# Patient Record
Sex: Female | Born: 1965 | Race: Black or African American | Hispanic: No | Smoking: Never smoker
Health system: Southern US, Community
[De-identification: ages and names within clinical notes are randomized; demographics above are authoritative.]

## PROBLEM LIST (undated history)

## (undated) DIAGNOSIS — M459 Ankylosing spondylitis of unspecified sites in spine: Secondary | ICD-10-CM

## (undated) DIAGNOSIS — E282 Polycystic ovarian syndrome: Secondary | ICD-10-CM

## (undated) DIAGNOSIS — H209 Unspecified iridocyclitis: Secondary | ICD-10-CM

## (undated) DIAGNOSIS — J45909 Unspecified asthma, uncomplicated: Secondary | ICD-10-CM

## (undated) DIAGNOSIS — IMO0002 Reserved for concepts with insufficient information to code with codable children: Secondary | ICD-10-CM

## (undated) DIAGNOSIS — G4730 Sleep apnea, unspecified: Secondary | ICD-10-CM

## (undated) HISTORY — DX: Sleep apnea, unspecified: G47.30

## (undated) HISTORY — DX: Unspecified asthma, uncomplicated: J45.909

## (undated) HISTORY — DX: Reserved for concepts with insufficient information to code with codable children: IMO0002

## (undated) HISTORY — PX: OTHER SURGICAL HISTORY: SHX169

## (undated) HISTORY — DX: Ankylosing spondylitis of unspecified sites in spine: M45.9

## (undated) HISTORY — PX: LIPOSUCTION: SHX10

## (undated) HISTORY — DX: Unspecified iridocyclitis: H20.9

## (undated) HISTORY — DX: Polycystic ovarian syndrome: E28.2

---

## 2000-09-05 HISTORY — PX: LEEP: SHX91

## 2005-11-27 ENCOUNTER — Emergency Department (HOSPITAL_COMMUNITY): Admission: EM | Admit: 2005-11-27 | Discharge: 2005-11-27 | Payer: Self-pay | Admitting: Emergency Medicine

## 2006-02-27 ENCOUNTER — Ambulatory Visit (HOSPITAL_COMMUNITY): Admission: RE | Admit: 2006-02-27 | Discharge: 2006-02-27 | Payer: Self-pay | Admitting: Ophthalmology

## 2007-06-01 ENCOUNTER — Encounter: Admission: RE | Admit: 2007-06-01 | Discharge: 2007-06-01 | Payer: Self-pay | Admitting: Obstetrics & Gynecology

## 2007-08-09 ENCOUNTER — Other Ambulatory Visit: Payer: Self-pay | Admitting: Family Medicine

## 2007-08-09 ENCOUNTER — Inpatient Hospital Stay (HOSPITAL_COMMUNITY): Admission: AD | Admit: 2007-08-09 | Discharge: 2007-08-10 | Payer: Self-pay | Admitting: Obstetrics

## 2007-08-09 ENCOUNTER — Other Ambulatory Visit: Payer: Self-pay | Admitting: Emergency Medicine

## 2007-08-30 ENCOUNTER — Emergency Department (HOSPITAL_COMMUNITY): Admission: EM | Admit: 2007-08-30 | Discharge: 2007-08-30 | Payer: Self-pay | Admitting: Emergency Medicine

## 2007-09-01 ENCOUNTER — Emergency Department (HOSPITAL_COMMUNITY): Admission: EM | Admit: 2007-09-01 | Discharge: 2007-09-01 | Payer: Self-pay | Admitting: Family Medicine

## 2007-09-04 ENCOUNTER — Emergency Department (HOSPITAL_COMMUNITY): Admission: EM | Admit: 2007-09-04 | Discharge: 2007-09-04 | Payer: Self-pay | Admitting: Emergency Medicine

## 2007-09-09 ENCOUNTER — Emergency Department (HOSPITAL_COMMUNITY): Admission: EM | Admit: 2007-09-09 | Discharge: 2007-09-09 | Payer: Self-pay | Admitting: Emergency Medicine

## 2008-06-02 ENCOUNTER — Encounter: Admission: RE | Admit: 2008-06-02 | Discharge: 2008-06-02 | Payer: Self-pay | Admitting: Obstetrics

## 2008-06-17 ENCOUNTER — Other Ambulatory Visit: Admission: RE | Admit: 2008-06-17 | Discharge: 2008-06-17 | Payer: Self-pay | Admitting: Obstetrics and Gynecology

## 2008-06-20 ENCOUNTER — Ambulatory Visit (HOSPITAL_COMMUNITY): Admission: RE | Admit: 2008-06-20 | Discharge: 2008-06-20 | Payer: Self-pay | Admitting: Obstetrics and Gynecology

## 2009-02-19 ENCOUNTER — Emergency Department (HOSPITAL_COMMUNITY): Admission: EM | Admit: 2009-02-19 | Discharge: 2009-02-19 | Payer: Self-pay | Admitting: Family Medicine

## 2009-04-14 ENCOUNTER — Emergency Department (HOSPITAL_COMMUNITY): Admission: EM | Admit: 2009-04-14 | Discharge: 2009-04-14 | Payer: Self-pay | Admitting: Emergency Medicine

## 2009-05-18 ENCOUNTER — Emergency Department (HOSPITAL_COMMUNITY): Admission: EM | Admit: 2009-05-18 | Discharge: 2009-05-18 | Payer: Self-pay | Admitting: Emergency Medicine

## 2009-08-21 ENCOUNTER — Ambulatory Visit (HOSPITAL_COMMUNITY): Admission: RE | Admit: 2009-08-21 | Discharge: 2009-08-21 | Payer: Self-pay | Admitting: Obstetrics and Gynecology

## 2009-10-09 ENCOUNTER — Encounter: Admission: RE | Admit: 2009-10-09 | Discharge: 2009-10-09 | Payer: Self-pay | Admitting: Obstetrics and Gynecology

## 2009-11-03 ENCOUNTER — Encounter (INDEPENDENT_AMBULATORY_CARE_PROVIDER_SITE_OTHER): Payer: Self-pay | Admitting: Obstetrics and Gynecology

## 2009-11-03 ENCOUNTER — Ambulatory Visit (HOSPITAL_COMMUNITY): Admission: RE | Admit: 2009-11-03 | Discharge: 2009-11-04 | Payer: Self-pay | Admitting: Obstetrics and Gynecology

## 2009-11-03 HISTORY — PX: OTHER SURGICAL HISTORY: SHX169

## 2009-11-06 ENCOUNTER — Inpatient Hospital Stay (HOSPITAL_COMMUNITY): Admission: AD | Admit: 2009-11-06 | Discharge: 2009-11-06 | Payer: Self-pay | Admitting: Obstetrics and Gynecology

## 2009-12-09 ENCOUNTER — Ambulatory Visit: Admission: RE | Admit: 2009-12-09 | Discharge: 2009-12-09 | Payer: Self-pay | Admitting: Gynecology

## 2009-12-10 ENCOUNTER — Ambulatory Visit (HOSPITAL_COMMUNITY): Admission: RE | Admit: 2009-12-10 | Discharge: 2009-12-10 | Payer: Self-pay | Admitting: Gynecology

## 2009-12-25 ENCOUNTER — Ambulatory Visit (HOSPITAL_COMMUNITY): Admission: RE | Admit: 2009-12-25 | Discharge: 2009-12-25 | Payer: Self-pay | Admitting: Gynecology

## 2010-06-11 ENCOUNTER — Ambulatory Visit: Admission: RE | Admit: 2010-06-11 | Discharge: 2010-06-11 | Payer: Self-pay | Admitting: Gynecology

## 2010-11-24 LAB — MISCELLANEOUS TEST

## 2010-11-25 LAB — BASIC METABOLIC PANEL
CO2: 27 mEq/L (ref 19–32)
Calcium: 8.8 mg/dL (ref 8.4–10.5)
Chloride: 109 mEq/L (ref 96–112)
GFR calc Af Amer: >60 mL/min (ref >60)
Potassium: 3.1 mEq/L — ABNORMAL LOW (ref 3.5–5.1)

## 2010-11-25 LAB — CBC
Hemoglobin: 13.7 g/dL (ref 12.0–15.0)
RBC: 5.02 MIL/uL (ref 3.87–5.11)
WBC: 4.1 10*3/uL (ref 4.0–10.5)

## 2010-11-25 LAB — PREGNANCY, URINE: Preg Test, Ur: NEGATIVE

## 2010-11-25 LAB — TYPE AND SCREEN

## 2010-12-06 LAB — CBC
MCHC: 33.1 g/dL (ref 30.0–36.0)
MCV: 83.9 fL (ref 78.0–100.0)
RDW: 14.9 % (ref 11.5–15.5)
WBC: 5.4 10*3/uL (ref 4.0–10.5)

## 2010-12-06 LAB — BASIC METABOLIC PANEL
BUN: 9 mg/dL (ref 6–23)
CO2: 25 mEq/L (ref 19–32)
Chloride: 105 mEq/L (ref 96–112)
Creatinine, Ser: 0.67 mg/dL (ref 0.4–1.2)
GFR calc non Af Amer: >60 mL/min (ref >60)
Sodium: 137 mEq/L (ref 135–145)

## 2010-12-10 LAB — DIFFERENTIAL
Basophils Absolute: 0 10*3/uL (ref 0.0–0.1)
Basophils Relative: 1 % (ref 0–1)
Eosinophils Relative: 1 % (ref 0–5)
Lymphocytes Relative: 29 % (ref 12–46)
Monocytes Absolute: 0.4 10*3/uL (ref 0.1–1.0)

## 2010-12-10 LAB — CBC
HCT: 43.4 % (ref 36.0–46.0)
MCHC: 32.7 g/dL (ref 30.0–36.0)
Platelets: 177 10*3/uL (ref 150–400)
RBC: 5.14 MIL/uL — ABNORMAL HIGH (ref 3.87–5.11)

## 2010-12-10 LAB — URINALYSIS, ROUTINE W REFLEX MICROSCOPIC
Bilirubin Urine: NEGATIVE
Hgb urine dipstick: NEGATIVE
Nitrite: NEGATIVE
Protein, ur: NEGATIVE mg/dL
Urobilinogen, UA: 0.2 mg/dL (ref 0.0–1.0)

## 2010-12-10 LAB — COMPREHENSIVE METABOLIC PANEL
AST: 21 U/L (ref 0–37)
Albumin: 4 g/dL (ref 3.5–5.2)
Alkaline Phosphatase: 79 U/L (ref 39–117)
Chloride: 104 mEq/L (ref 96–112)
GFR calc Af Amer: >60 mL/min (ref >60)
Potassium: 3.7 mEq/L (ref 3.5–5.1)
Sodium: 140 mEq/L (ref 135–145)
Total Bilirubin: 0.8 mg/dL (ref 0.3–1.2)

## 2010-12-10 LAB — URINE MICROSCOPIC-ADD ON

## 2010-12-11 LAB — CBC
HCT: 45.3 % (ref 36.0–46.0)
Hemoglobin: 14.7 g/dL (ref 12.0–15.0)
MCHC: 32.5 g/dL (ref 30.0–36.0)
MCV: 81.4 fL (ref 78.0–100.0)
RDW: 16.3 % — ABNORMAL HIGH (ref 11.5–15.5)

## 2010-12-11 LAB — URINALYSIS, ROUTINE W REFLEX MICROSCOPIC
Bilirubin Urine: NEGATIVE
Ketones, ur: NEGATIVE mg/dL
Nitrite: NEGATIVE
pH: 5 (ref 5.0–8.0)

## 2010-12-11 LAB — WET PREP, GENITAL: WBC, Wet Prep HPF POC: NONE SEEN

## 2010-12-11 LAB — DIFFERENTIAL
Basophils Absolute: 0.1 10*3/uL (ref 0.0–0.1)
Basophils Relative: 1 % (ref 0–1)
Eosinophils Absolute: 0.1 10*3/uL (ref 0.0–0.7)
Eosinophils Relative: 2 % (ref 0–5)
Monocytes Absolute: 0.6 10*3/uL (ref 0.1–1.0)
Neutro Abs: 2.6 10*3/uL (ref 1.7–7.7)

## 2010-12-11 LAB — BASIC METABOLIC PANEL
CO2: 23 mEq/L (ref 19–32)
Calcium: 9 mg/dL (ref 8.4–10.5)
Chloride: 105 mEq/L (ref 96–112)
Glucose, Bld: 93 mg/dL (ref 70–99)
Sodium: 137 mEq/L (ref 135–145)

## 2010-12-13 LAB — POCT RAPID STREP A (OFFICE): Streptococcus, Group A Screen (Direct): NEGATIVE

## 2011-01-11 ENCOUNTER — Other Ambulatory Visit: Payer: Self-pay | Admitting: Obstetrics and Gynecology

## 2011-01-11 DIAGNOSIS — Z1231 Encounter for screening mammogram for malignant neoplasm of breast: Secondary | ICD-10-CM

## 2011-01-17 ENCOUNTER — Ambulatory Visit
Admission: RE | Admit: 2011-01-17 | Discharge: 2011-01-17 | Disposition: A | Discharge status: Home / Self Care | Payer: Federal, State, Local not specified - PPO | Source: Ambulatory Visit | Attending: Obstetrics and Gynecology | Admitting: Obstetrics and Gynecology

## 2011-01-17 DIAGNOSIS — Z1231 Encounter for screening mammogram for malignant neoplasm of breast: Secondary | ICD-10-CM

## 2011-01-18 NOTE — H&P (Signed)
Bridget Harrison, Bridget Harrison               ACCOUNT NO.:  [id number]   MEDICAL RECORD NO.:  [id number]          PATIENT TYPE:  MAT   LOCATION:  [facility]                          FACILITY:  [facility]   PHYSICIAN:  Lendon Colonel, MD   DATE OF BIRTH:  [birth year]   DATE OF ADMISSION:  08/09/2007  DATE OF DISCHARGE:  08/10/2007                              HISTORY & PHYSICAL   CHIEF COMPLAINT:  Vaginal bleeding, pelvic pain.   HISTORY OF PRESENT ILLNESS:  This is a 45 year old G0, non-pregnant  patient who presents with increasing vaginal bleeding and pelvic pain.  Of note, the patient was seen in the office today for similar  complaints.  Pelvic exam showed two scopettes of blood and a hemoglobin  of 15.5.  Her hemoglobin one month ago was 15.5.  The patient is on her  withdrawal week of her first pack of OCP's.  The patient was started on  OCP's approximately three weeks ago for history of PCOS and  oligomenorrhea and attempt for cycle control.  After leaving the  [facility] office earlier today the patient had worsening pelvic  pain and the pain was a 9 out of 10.  She presented to [facility].  There she was found to have moderate amount of vaginal bleeding  with overall normal exam. Her hemoglobin there was 17 and her  chemistries were within normal limits.  The patient was discharged with  instructions to followup here at [facility].  The patient notes her pain after  taking 800 mg of Motrin that she rates of 8 to 9 to a #1.  She still  notes bleeding, changing pads every one hour.  The patient denies  headache, dizziness, chest pain or shortness of breath.   PAST MEDICAL HISTORY:  Significant for oligomenorrhea and polycystic  ovarian syndrome.   PHYSICAL EXAMINATION:  VITAL SIGNS:  On admission she was afebrile.  She  was slightly hypertensive but was not tachycardic.  CARDIOVASCULAR:  Regular rate and rhythm.  PULMONARY:  Clear to auscultation bilaterally.  ABDOMEN:  Soft,  nontender, no rebound, no guarding.  GENITOURINARY:  Exam reveals moderate amount of bleeding from the  cervix.  Three scoppettes of blood and clot in the vagina.   ASSESSMENT/PLAN:  In summary, this is a 45 year old G0 with polycystic  ovarian syndrome, oligomenorrhea, and menorrhagia.  The patient was  reassured, given her normal hemoglobin in the office today and again at  [facility].  The patient was instructed to increase  hydration and was instructed to return for worsening bleeding, worsening  pelvic pain, shortness of breath, dizziness, light headedness or chest  pain.  The patient was instructed on Motrin 800 mg three times daily to  be taken until the bleeding ceases.  She was also instructed to start  high dose oral contraception pills.  The patient has been given a  prescription for Lo-Estrin earlier today and she was instructed to take  two pills p.o. q.12h. for 3 days and two pills p.o. daily for 3 days and  then 1 pill for the  remainder  of the pack.  She is instructed to skip her placebo week and begin a new  pack right away.  The patient will follow up in the office on August 20, 2007 for ultrasound and follow up of vaginal bleeding.  She was  instructed to call sooner with any problems.  The patient was also given  Percocet #10 to take q.4-6h. p.r.n. break-through pain.      Lendon Colonel, MD  Electronically Signed     KAF/MEDQ  D:  08/10/2007  T:  08/10/2007  Job:  [id number]

## 2011-04-21 IMAGING — US US PELVIS COMPLETE MODIFY
1 series · 13 of 25 positions shown · non-contrast
Comparison: None

CLINICAL DATA: Abdominal and pelvic pain.

TRANSABDOMINAL AND TRANSVAGINAL ULTRASOUND OF PELVIS
TECHNIQUE: Both transabdominal and transvaginal ultrasound
examinations of the pelvis were performed including evaluation of
the uterus, ovaries, adnexal regions, and pelvic cul-de-sac.

[Series 1: us pelvis complete modify · 0.28mm/px · 13 of 106 slices shown]
[im 1/106]
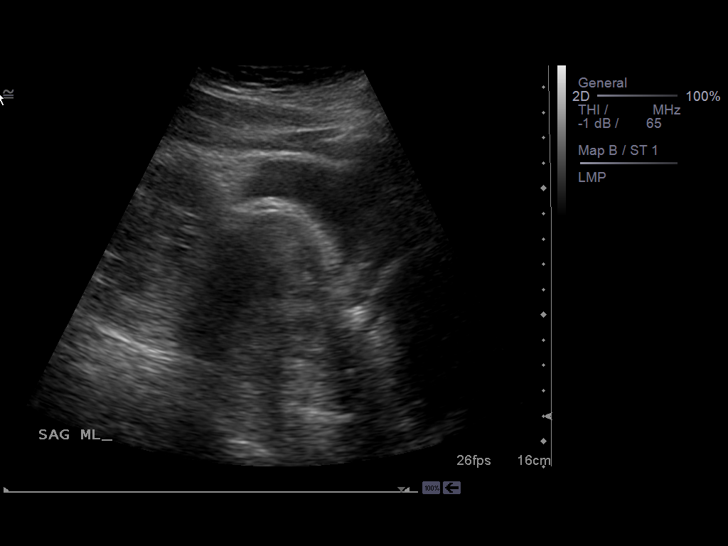
[im 9/106]
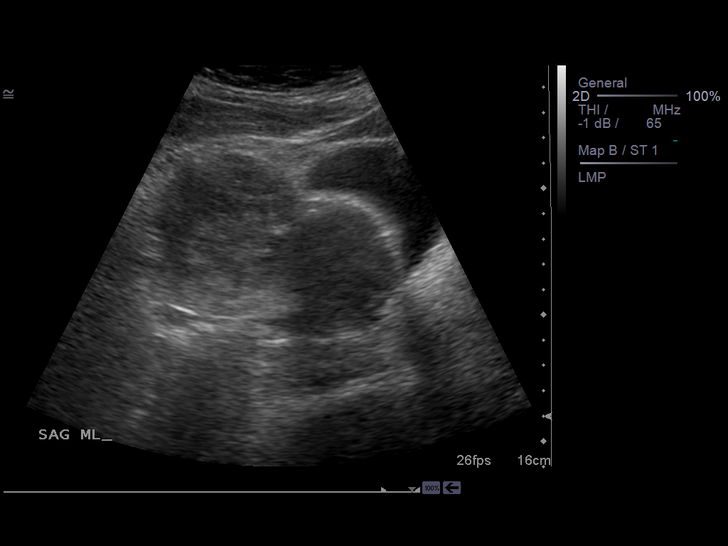
[im 18/106]
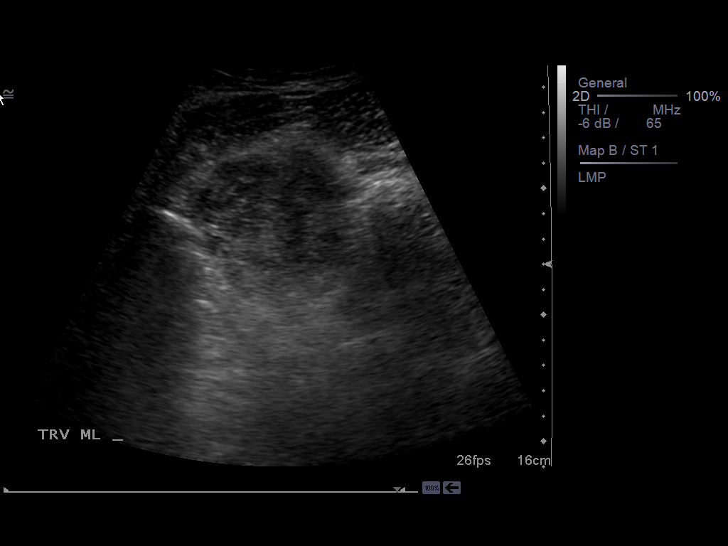
[im 27/106]
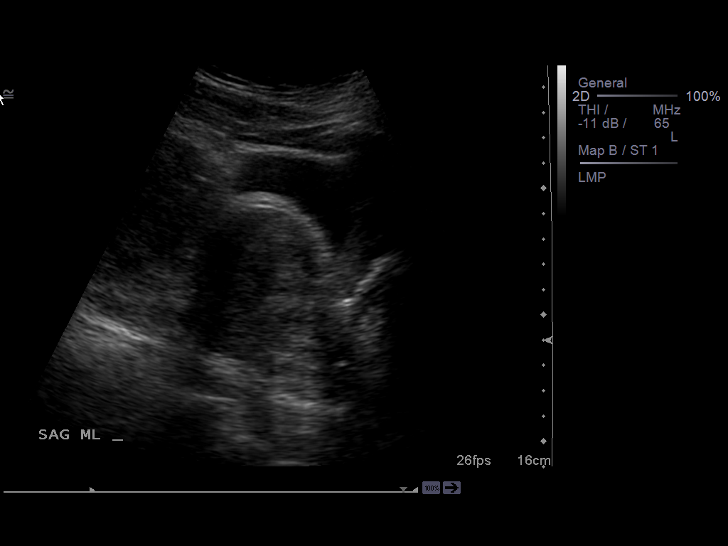
[im 36/106]
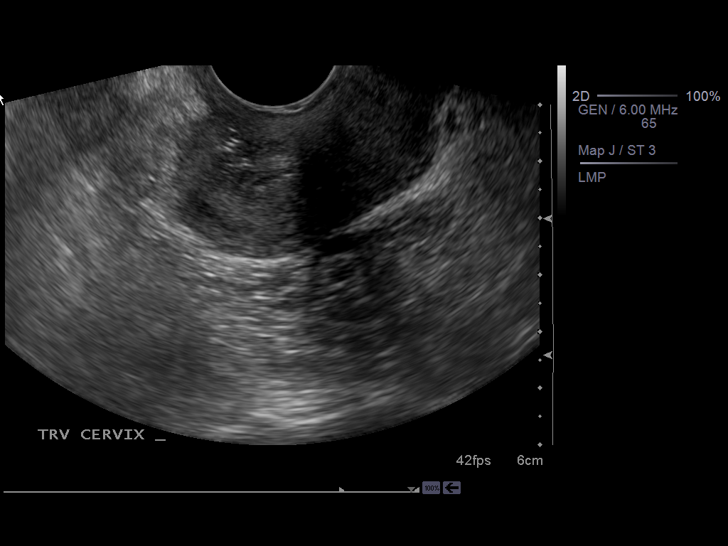
[im 44/106]
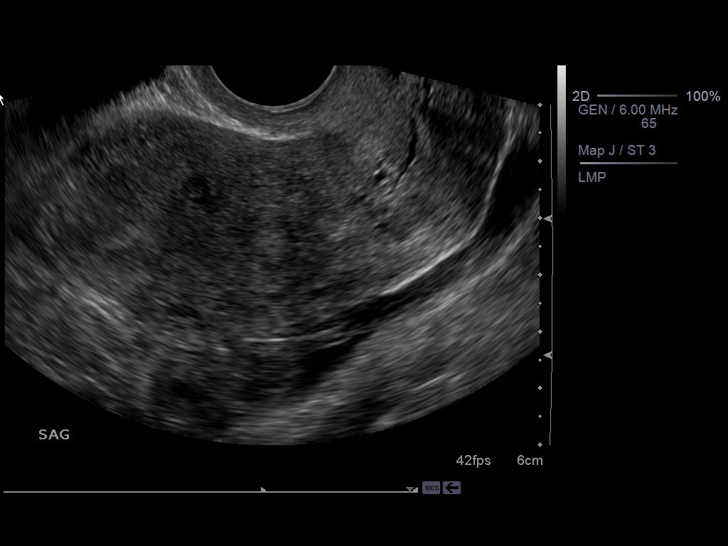
[im 53/106]
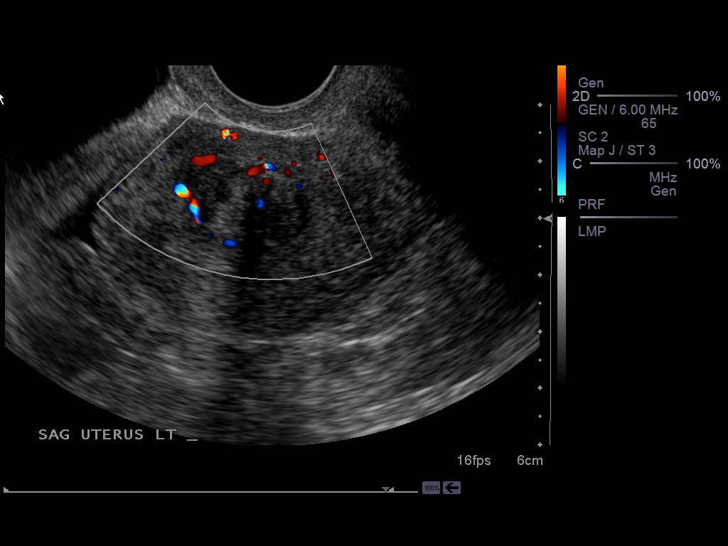
[im 62/106]
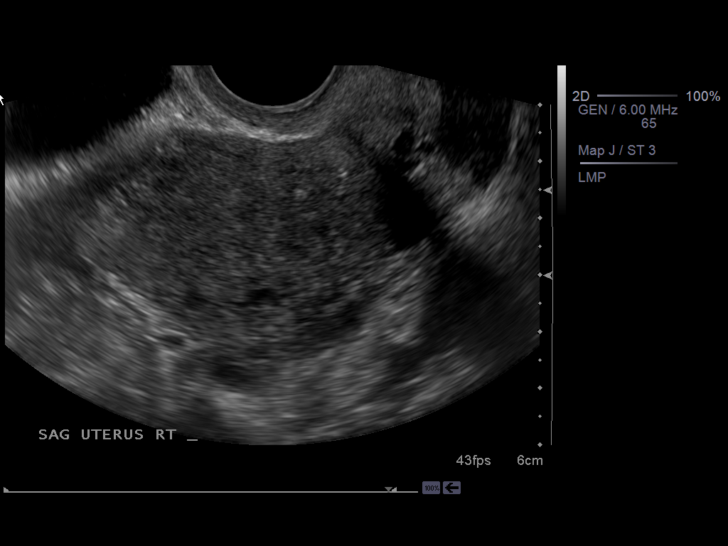
[im 71/106]
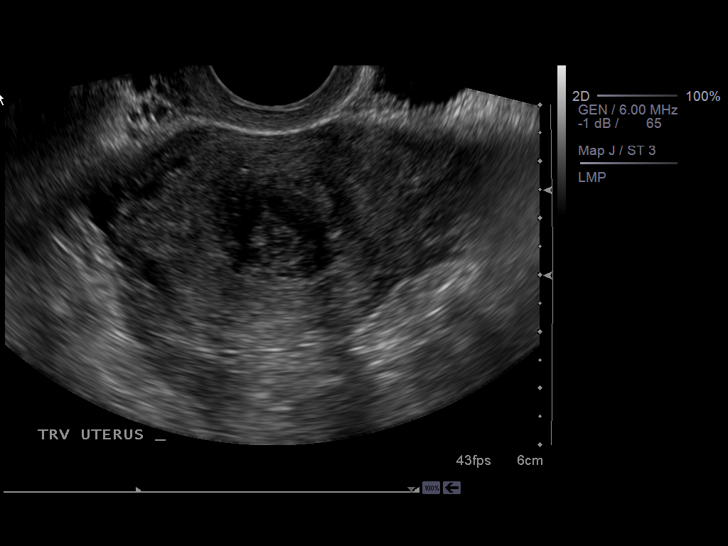
[im 79/106]
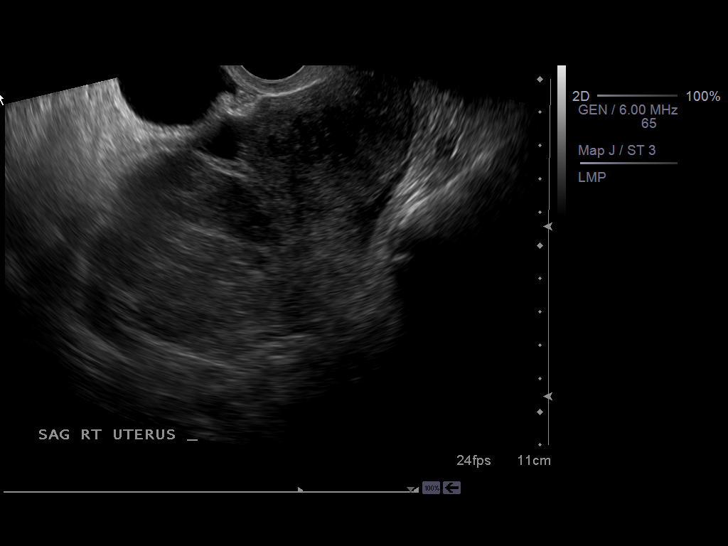
[im 88/106]
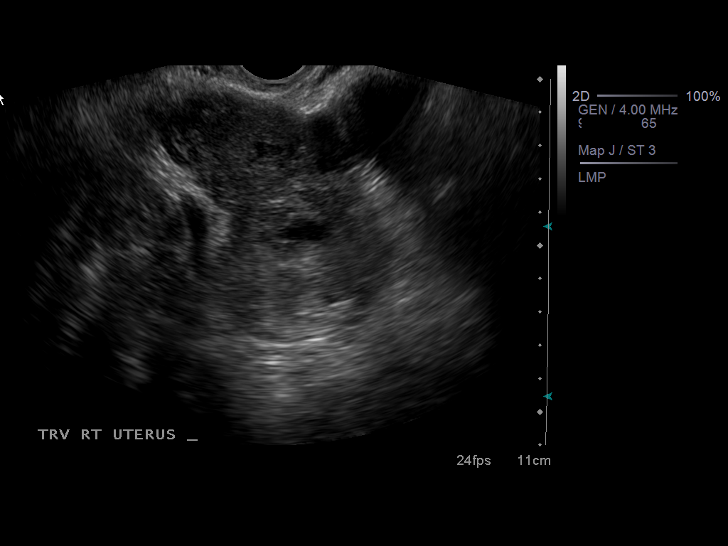
[im 97/106]
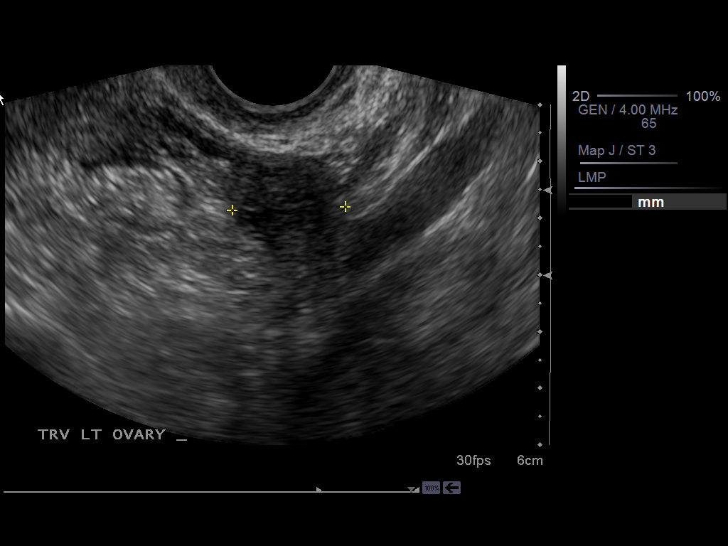
[im 106/106]
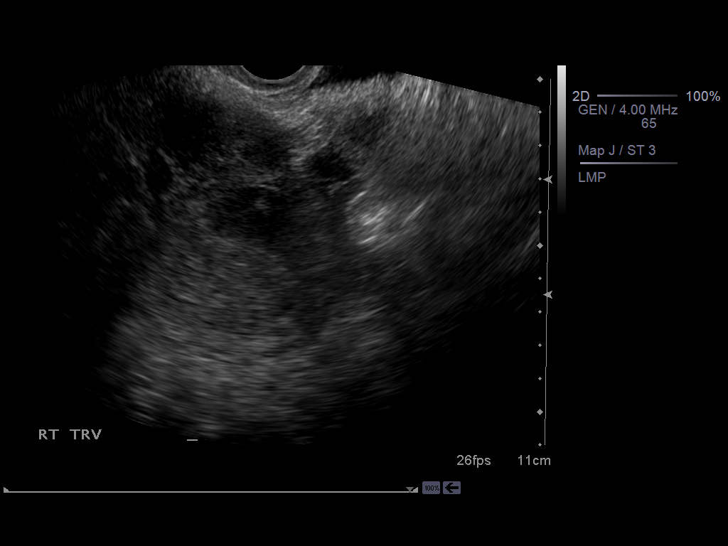

[13 of 25 positions shown; findings below may reference images not displayed]

FINDINGS: Uterus measures 8.6 x 4.3 x 6.1 cm.  Multiple fibroids are
identified.  The largest is pedunculated and in the posterior
fundal region, measuring 8.4 x 5.8 x 6.3 cm.  An anterior
pedunculated fibroid is 2.7 x 2.5 x 2.1 cm.  A central uterine
fibroid is 2.4 x 1.7 x 2.1 cm.  This latter fibroid displaces the
endometrium posteriorly.  A lower uterine segment central fibroid
is 1.4 x 1.3 x 1.5 cm. Small fundal fibroid is 1.0 x 0.8 x 1.0 cm.

Endometrium measures 6 mm in thickness.  The endometrium is
posteriorly displaced by central uterine fibroids.

Right Ovary is not visualized.

Left Ovary measures 2.1 x 1.7 x 2.0 cm.

Other Findings:  No free pelvic fluid is identified.
IMPRESSION: Multiple uterine fibroids.  The largest, pedunculated fibroid in
the posterior fundal region has increased in size compared to the
prior ultrasound exam on 06/20/2008.  On the prior study, this
fibroid measured 5.6 x 4.7 x 5.1 cm.

## 2011-06-10 LAB — RAPID STREP SCREEN (MED CTR MEBANE ONLY): Streptococcus, Group A Screen (Direct): NEGATIVE

## 2011-06-10 LAB — STREP A DNA PROBE

## 2011-06-13 LAB — POCT URINALYSIS DIP (DEVICE)
Ketones, ur: 15 — AB
Protein, ur: 100 — AB
Specific Gravity, Urine: >=1.03
Urobilinogen, UA: 1

## 2011-06-13 LAB — I-STAT 8, (EC8 V) (CONVERTED LAB)
Acid-Base Excess: 1
Chloride: 107
Glucose, Bld: 91
Hemoglobin: 17 — ABNORMAL HIGH
Potassium: 3.9
Sodium: 139
pH, Ven: 7.396 — ABNORMAL HIGH

## 2011-06-13 LAB — POCT PREGNANCY, URINE: Operator id: 239701

## 2012-01-17 ENCOUNTER — Telehealth: Payer: Self-pay | Admitting: Obstetrics and Gynecology

## 2012-01-17 NOTE — Telephone Encounter (Signed)
SR PT 

## 2012-01-17 NOTE — Telephone Encounter (Signed)
Triage received 

## 2012-01-20 NOTE — Telephone Encounter (Signed)
Pt states 01-27-12 is fine for her appt and didn't ask for anything sooner   ld

## 2012-01-27 ENCOUNTER — Encounter: Payer: Self-pay | Admitting: Obstetrics and Gynecology

## 2012-02-15 ENCOUNTER — Ambulatory Visit (INDEPENDENT_AMBULATORY_CARE_PROVIDER_SITE_OTHER): Payer: Federal, State, Local not specified - PPO | Admitting: Obstetrics and Gynecology

## 2012-02-15 ENCOUNTER — Encounter: Payer: Self-pay | Admitting: Obstetrics and Gynecology

## 2012-02-15 VITALS — BP 110/76 | Wt 164.0 lb

## 2012-02-15 DIAGNOSIS — N949 Unspecified condition associated with female genital organs and menstrual cycle: Secondary | ICD-10-CM

## 2012-02-15 DIAGNOSIS — D3910 Neoplasm of uncertain behavior of unspecified ovary: Secondary | ICD-10-CM

## 2012-02-15 DIAGNOSIS — Z124 Encounter for screening for malignant neoplasm of cervix: Secondary | ICD-10-CM

## 2012-02-15 DIAGNOSIS — R102 Pelvic and perineal pain: Secondary | ICD-10-CM

## 2012-02-15 DIAGNOSIS — R87612 Low grade squamous intraepithelial lesion on cytologic smear of cervix (LGSIL): Secondary | ICD-10-CM

## 2012-02-15 NOTE — Progress Notes (Deleted)
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 Pt stated that she is still getting right side pain still

## 2012-02-15 NOTE — Progress Notes (Signed)
Contraception: condoms History of STD:  no history of PID, STD's History of ovarian cyst: yes:   History of fibroids: yes  History of endometriosis:no Previous ultrasound:no  Urinary symptoms: none Gastro-intestinal symptoms:  Constipation: no     Diarrhea: no     Nausea: no     Vomiting: no     Fever: no Vaginal discharge: no vaginal discharge  Pt stated that she has a 6 month f/u  From surgery 11/2009 pt stated sometime feels pain on her right side   Subjective:    Bridget Harrison is a 46 y.o. 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 Needs AMH ans InhibinB every 6 months. Last pap 04/2011 LGSIL. Colpo: CIN 1     History   Social History  . Marital Status: Divorced    Spouse Name: N/A    Number of Children: N/A  . Years of Education: N/A   Social History Main Topics  . Smoking status: Never Smoker   . Smokeless tobacco: Never Used  . Alcohol Use: No  . Drug Use: No  . Sexually Active: Yes    Birth Control/ Protection: Condom   Other Topics Concern  . None   Social History Narrative  . None    Menstrual cycle:   LMP: Patient's last menstrual period was 01/21/2012.           Cycle: normal  The following portions of the patient's history were reviewed and updated as appropriate: allergies, current medications, past family history, past medical history, past social history, past surgical history and problem list.  Review of Systems Pertinent items are noted in HPI. Breast:Negative for breast lump,nipple discharge or nipple retraction Gastrointestinal: Negative for abdominal pain, change in bowel habits or rectal bleeding Urinary:negative   Objective:    BP 110/76  Wt 164 lb (74.39 kg)  LMP 01/21/2012    Weight:  Wt Readings from Last 1 Encounters:  02/15/12 164 lb (74.39 kg)          BMI: There is no height on file to calculate BMI.  General Appearance: Alert, appropriate appearance for  age. No acute distress HEENT: Grossly normal Neck / Thyroid: Supple, no masses, nodes or enlargement Lungs: clear to auscultation bilaterally Back: No CVA tenderness Breast Exam: No masses or nodes.No dimpling, nipple retraction or discharge. Cardiovascular: Regular rate and rhythm. S1, S2, no murmur Gastrointestinal: Soft, non-tender, no masses or organomegaly Pelvic Exam: Vulva and vagina appear normal. Bimanual exam reveals normal uterus and adnexa. Rectovaginal: declined by patient Lymphatic Exam: Non-palpable nodes in neck, clavicular, axillary, or inguinal regions Skin: no rash or abnormalities Neurologic: Normal gait and speech, no tremor  Psychiatric: Alert and oriented, appropriate affect.     Assessment:    Normal gyn exam Granulosa Cell tumor 6 month check  LGSIL Unexplained RLQ pain   Plan:    mammogram pap smear Return for ultrasound and labs STD screening: declined Contraception:no method      Kimberley Dastrup AMD

## 2012-02-20 LAB — PAP IG W/ RFLX HPV ASCU

## 2012-03-09 DIAGNOSIS — J45909 Unspecified asthma, uncomplicated: Secondary | ICD-10-CM

## 2012-03-09 DIAGNOSIS — N870 Mild cervical dysplasia: Secondary | ICD-10-CM

## 2012-03-09 DIAGNOSIS — H2013 Chronic iridocyclitis, bilateral: Secondary | ICD-10-CM

## 2012-03-09 DIAGNOSIS — G4730 Sleep apnea, unspecified: Secondary | ICD-10-CM

## 2012-03-09 DIAGNOSIS — M459 Ankylosing spondylitis of unspecified sites in spine: Secondary | ICD-10-CM

## 2012-03-09 DIAGNOSIS — E282 Polycystic ovarian syndrome: Secondary | ICD-10-CM

## 2012-03-13 ENCOUNTER — Other Ambulatory Visit: Payer: Self-pay | Admitting: Obstetrics and Gynecology

## 2012-03-13 DIAGNOSIS — R1031 Right lower quadrant pain: Secondary | ICD-10-CM

## 2012-03-14 ENCOUNTER — Ambulatory Visit (INDEPENDENT_AMBULATORY_CARE_PROVIDER_SITE_OTHER): Payer: Federal, State, Local not specified - PPO | Admitting: Obstetrics and Gynecology

## 2012-03-14 ENCOUNTER — Other Ambulatory Visit: Payer: Self-pay | Admitting: Obstetrics and Gynecology

## 2012-03-14 ENCOUNTER — Ambulatory Visit (INDEPENDENT_AMBULATORY_CARE_PROVIDER_SITE_OTHER): Payer: Federal, State, Local not specified - PPO

## 2012-03-14 ENCOUNTER — Encounter: Payer: Self-pay | Admitting: Obstetrics and Gynecology

## 2012-03-14 VITALS — BP 112/72 | Wt 167.0 lb

## 2012-03-14 DIAGNOSIS — R1031 Right lower quadrant pain: Secondary | ICD-10-CM

## 2012-03-14 DIAGNOSIS — D3910 Neoplasm of uncertain behavior of unspecified ovary: Secondary | ICD-10-CM

## 2012-03-14 DIAGNOSIS — N870 Mild cervical dysplasia: Secondary | ICD-10-CM

## 2012-03-14 NOTE — Progress Notes (Signed)
Subjective:  Pt. Is here today for a follow up u/s from having pelvic pain.S/P robotic myomectomy and RSO 11/2009 Granulosa cell tumor.   Objective:  Sono:   Anteverted uterus, normal endometrium, normal LTOV, surgically absent RTOV, no fluid in CDS, normal adnexas.  Anterior intrmural fibroid = 3.5 x 2.1 x 3.5cm slight increase from 2 cm  Assessment:  Normal ultrasound  Plan:  AMH and Inhibin B today and every 6 months until 12/2013 Sono every 6 months until 12/2013 Needs repeat Pap at next visit for CIN 1 diagnosed 04/2011  Pap 02/2012 was normal

## 2012-03-19 ENCOUNTER — Telehealth: Payer: Self-pay | Admitting: Obstetrics and Gynecology

## 2012-03-19 NOTE — Telephone Encounter (Signed)
Laura/sr pt °

## 2012-03-19 NOTE — Telephone Encounter (Signed)
sr pt 

## 2012-03-19 NOTE — Telephone Encounter (Signed)
RC to [employer].  Ordering MD and date of collection needed.  ld

## 2012-03-20 ENCOUNTER — Telehealth: Payer: Self-pay

## 2012-03-20 NOTE — Telephone Encounter (Signed)
LM for [facility] onc regarding AMH result.  Requested call back with instructions.  ld

## 2012-03-28 ENCOUNTER — Telehealth: Payer: Self-pay

## 2012-03-28 NOTE — Telephone Encounter (Signed)
FYI      Spoke with Harriett Sine at gyn/onc. Harriett Sine states that Dr Stanford Breed will review on Friday and let us know what if anything should be done at this point. Will notify patient when [place] call with information.

## 2012-03-28 NOTE — Telephone Encounter (Signed)
Message copied by Larwance Rote on Wed Mar 28, 2012 11:13 AM ------      Message from: Silverio Lay      Created: Tue Mar 27, 2012 10:37 AM      Regarding: AMH results       Dr Stanford Breed tried to call me yesterday (even though I had told his nurse I was out of town for 2 weeks) but missed his call and did not leave a message. Please call Harriett Sine at [phone] and find out what needs to be done for [facility]. Text me back when you find out..it takes an act of Congress to login from my woods!

## 2012-04-12 ENCOUNTER — Ambulatory Visit: Payer: Federal, State, Local not specified - PPO | Admitting: Obstetrics and Gynecology

## 2014-04-15 ENCOUNTER — Telehealth: Payer: Self-pay | Admitting: *Deleted

## 2014-04-15 NOTE — Telephone Encounter (Signed)
Received a email from [facility] that contained an attachment from [facility] regarding pt and f/u. Called and left VM regarding pt concerns with Clenton Pare. The correct number for GYN Oncology in [place] was left on Voice mail as well.

## 2014-04-16 ENCOUNTER — Telehealth: Payer: Self-pay | Admitting: *Deleted

## 2014-04-16 NOTE — Telephone Encounter (Signed)
Call from [facility] regarding f/u appt with Dr. Wayland Salinas. Discussed with Loralee Pacas pt has not been seen since 2013, we will need a new referral for pt along with additional records. Records to be faxed over and appt with Dr. Wayland Salinas will be made upon receipt.

## 2014-04-18 ENCOUNTER — Ambulatory Visit: Payer: Federal, State, Local not specified - PPO | Admitting: Gynecology

## 2014-04-22 ENCOUNTER — Telehealth: Payer: Self-pay | Admitting: *Deleted

## 2014-04-22 NOTE — Telephone Encounter (Signed)
Pt to be seen 8/20, called [facility] [place]  for pt records be faxed.

## 2014-04-24 ENCOUNTER — Encounter: Payer: Self-pay | Admitting: Gynecologic Oncology

## 2014-04-24 ENCOUNTER — Ambulatory Visit: Payer: Federal, State, Local not specified - PPO | Attending: Gynecologic Oncology | Admitting: Gynecologic Oncology

## 2014-04-24 VITALS — BP 139/86 | HR 79 | Temp 98.1°F | Resp 18 | Ht 63.0 in | Wt 169.5 lb

## 2014-04-24 DIAGNOSIS — N921 Excessive and frequent menstruation with irregular cycle: Secondary | ICD-10-CM | POA: Diagnosis not present

## 2014-04-24 DIAGNOSIS — N926 Irregular menstruation, unspecified: Secondary | ICD-10-CM | POA: Diagnosis not present

## 2014-04-24 DIAGNOSIS — Z8742 Personal history of other diseases of the female genital tract: Secondary | ICD-10-CM

## 2014-04-24 DIAGNOSIS — D3911 Neoplasm of uncertain behavior of right ovary: Secondary | ICD-10-CM

## 2014-04-24 DIAGNOSIS — E349 Endocrine disorder, unspecified: Secondary | ICD-10-CM | POA: Diagnosis not present

## 2014-04-24 DIAGNOSIS — D3910 Neoplasm of uncertain behavior of unspecified ovary: Secondary | ICD-10-CM

## 2014-04-24 DIAGNOSIS — D259 Leiomyoma of uterus, unspecified: Secondary | ICD-10-CM | POA: Insufficient documentation

## 2014-04-24 DIAGNOSIS — E279 Disorder of adrenal gland, unspecified: Secondary | ICD-10-CM

## 2014-04-24 NOTE — Progress Notes (Signed)
Consult Note: Gyn-Onc  Consult was requested by Dr. Cletis Media for the evaluation of Bridget Harrison 48 y.o. 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  CC:  Chief Complaint  Patient presents with  . Hormone levels are elvated    Assessment/Plan:  Bridget Harrison  is a 48 y.o.  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  She has no clinical evidence of recurrence on physical exam. Symptomatically she appears to be perimenopausal. These hormonal tumor markers can have some variance and difficulty in interpretation in premenopausal women. I am recommending CT of the chest, abdomen and pelvis to rule out apparent recurrent disease, and if normal, recommend more closely monitoring these levels for stability.    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 She has been NED since that time (though with some patchy followup). She was seen for surveillance in July 2015.   Her most recent antimullarian hormone level is mildly elevated at 30.16 these values were taken on 03/24/2014. Anti mullarian hormone was previously her 4.2 on 03/19/2012 which was considered borderline high. Inhibin B level is 139 on 03/24/2014 it is considered which is within normal range for a woman of her age.   She reports irregular menses - missed periods and intermenstrual bleeding.   US performed in July 2015 showed a normalsized uterus with 4 small (2-3cm) fibroids. No pelvic masses were visualized. No ovarian masses were seen. The left ovary measured 2.7cm.   Interval History: She continues to have no bloating or abdominal distension or abdominal pain no edema and no shortness of breath.   Current Meds:  Outpatient Encounter Prescriptions as of  04/24/2014  Medication Sig  . Omega-3 Fatty Acids (FISH OIL CONCENTRATE PO) Take 1 each by mouth.  . [DISCONTINUED] folic acid (FOLVITE) 1 MG tablet Take 1 mg by mouth daily.    Allergy: No Known Allergies  Social Hx:   History   Social History  . Marital Status: Divorced    Spouse Name: N/A    Number of Children: N/A  . Years of Education: N/A   Occupational History  . Not on file.   Social History Main Topics  . Smoking status: Never Smoker   . Smokeless tobacco: Never Used  . Alcohol Use: No  . Drug Use: No  . Sexual Activity: Yes    Birth Control/ Protection: Condom   Other Topics Concern  . Not on file   Social History Narrative  . No narrative on file    Past Surgical Hx:  Past Surgical History  Procedure Laterality Date  . Robotic myomectomy  11/2009  . Liposuction      abd.   [place] Parents      Granulosa tumor  . Leep  2002    Past Medical Hx:  Past Medical History  Diagnosis Date  . Abnormal Pap smear   . Asthma     childhood   . Sleep apnea   . PCOS (polycystic ovarian syndrome)   . Ankylosing spondylitis     Past Gynecological History: stage I granulosa cell tumor of the ovary, perimenopausal.  No LMP recorded.  Family Hx:  Family History  Problem Relation Age of  Onset  . Stroke Paternal Grandfather   . Heart disease Paternal Grandfather   . Heart disease Paternal Grandmother   . Stroke Paternal Grandmother   . Colon cancer Maternal Grandmother   . Heart disease Maternal Grandmother   . Stroke Maternal Grandmother   . Stroke Maternal Grandfather   . Heart disease Maternal Grandfather   . Hypertension Mother   . Cancer Mother     cervical treated     Review of Systems:  Constitutional  Feels well,    ENT Normal appearing ears and nares bilaterally Skin/Breast  No rash, sores, jaundice, itching, dryness Cardiovascular  No chest pain, shortness of breath, or edema  Pulmonary  No cough or wheeze.  Gastro Intestinal  No nausea,  vomitting, or diarrhoea. No bright red blood per rectum, no abdominal pain, change in bowel movement, or constipation.  Genito Urinary  No frequency, urgency, dysuria, see HPI Musculo Skeletal  No myalgia, arthralgia, joint swelling or pain  Neurologic  No weakness, numbness, change in gait,  Psychology  No depression, anxiety, insomnia.   Vitals:  Blood pressure 139/86, pulse 79, temperature 98.1 F (36.7 C), temperature source Oral, resp. rate 18, height 5\' 3"  (1.6 m), weight 169 lb 8 oz (76.885 kg).  Physical Exam: WD in NAD Neck  Supple NROM, without any enlargements.  Lymph Node Survey No cervical supraclavicular or inguinal adenopathy Cardiovascular  Pulse normal rate, regularity and rhythm. S1 and S2 normal.  Lungs  Clear to auscultation bilateraly, without wheezes/crackles/rhonchi. Good air movement.  Skin  No rash/lesions/breakdown  Psychiatry  Alert and oriented to person, place, and time  Abdomen  Normoactive bowel sounds, abdomen soft, non-tender and obese without evidence of hernia.  Back No CVA tenderness Genito Urinary  Vulva/vagina: Normal external female genitalia.  No lesions. No discharge or bleeding.  Bladder/urethra:  No lesions or masses, well supported bladder  Vagina: normal with no lesions  Cervix: Normal appearing, no lesions.  Uterus: Small, mobile, no parametrial involvement or nodularity.  Adnexa: no palpable masses. Rectal  Good tone, no masses no cul de sac nodularity.  Extremities  No bilateral cyanosis, clubbing or edema.   Bridget Eva, MD   04/24/2014, 4:55 PM

## 2014-04-24 NOTE — Patient Instructions (Signed)
We will call you with the CT scan results

## 2014-04-30 ENCOUNTER — Telehealth: Payer: Self-pay | Admitting: *Deleted

## 2014-04-30 NOTE — Telephone Encounter (Signed)
Pt called advised she had to r/s CT scan for 8/27. pt r/s CT scan for Oct 12

## 2014-05-01 ENCOUNTER — Ambulatory Visit (HOSPITAL_COMMUNITY): Admission: RE | Admit: 2014-05-01 | Payer: Federal, State, Local not specified - PPO | Source: Ambulatory Visit

## 2014-06-16 ENCOUNTER — Encounter (HOSPITAL_COMMUNITY): Payer: Self-pay

## 2014-06-16 ENCOUNTER — Ambulatory Visit (HOSPITAL_COMMUNITY)
Admission: RE | Admit: 2014-06-16 | Discharge: 2014-06-16 | Disposition: A | Discharge status: Home / Self Care | Payer: Federal, State, Local not specified - PPO | Source: Ambulatory Visit | Attending: Gynecologic Oncology | Admitting: Gynecologic Oncology

## 2014-06-16 DIAGNOSIS — D3910 Neoplasm of uncertain behavior of unspecified ovary: Secondary | ICD-10-CM | POA: Diagnosis not present

## 2014-06-16 DIAGNOSIS — Z90721 Acquired absence of ovaries, unilateral: Secondary | ICD-10-CM | POA: Diagnosis not present

## 2014-06-16 MED ORDER — IOHEXOL 300 MG/ML  SOLN
100.0000 mL | Freq: Once | INTRAMUSCULAR | Status: AC | PRN
  Administered 2014-06-16: 100 mL via INTRAVENOUS

## 2014-06-24 ENCOUNTER — Telehealth: Payer: Self-pay | Admitting: *Deleted

## 2014-06-24 NOTE — Telephone Encounter (Signed)
Called pt with appt on Monday 10/26 with Dr,. Denman George. Pt states she would like to see Dr. Wayland Salinas as he has been her MD in the past. Discussed with pt he is not in the office on Monday and MD asked to see pt sooner than later to discuss CT results and treatment going forward. Pt verbalized understanding appt confirmed for Monday 10/26

## 2014-06-30 ENCOUNTER — Ambulatory Visit: Payer: Federal, State, Local not specified - PPO | Attending: Gynecologic Oncology | Admitting: Gynecologic Oncology

## 2014-06-30 ENCOUNTER — Encounter: Payer: Self-pay | Admitting: Gynecologic Oncology

## 2014-06-30 VITALS — BP 158/94 | HR 77 | Temp 98.6°F | Resp 18 | Ht 63.0 in | Wt 178.2 lb

## 2014-06-30 DIAGNOSIS — Z90721 Acquired absence of ovaries, unilateral: Secondary | ICD-10-CM

## 2014-06-30 DIAGNOSIS — E282 Polycystic ovarian syndrome: Secondary | ICD-10-CM | POA: Insufficient documentation

## 2014-06-30 DIAGNOSIS — Z1589 Genetic susceptibility to other disease: Secondary | ICD-10-CM

## 2014-06-30 DIAGNOSIS — D3910 Neoplasm of uncertain behavior of unspecified ovary: Secondary | ICD-10-CM

## 2014-06-30 DIAGNOSIS — R799 Abnormal finding of blood chemistry, unspecified: Secondary | ICD-10-CM

## 2014-06-30 DIAGNOSIS — Z8742 Personal history of other diseases of the female genital tract: Secondary | ICD-10-CM

## 2014-06-30 DIAGNOSIS — D3911 Neoplasm of uncertain behavior of right ovary: Secondary | ICD-10-CM | POA: Diagnosis not present

## 2014-06-30 DIAGNOSIS — Z7189 Other specified counseling: Secondary | ICD-10-CM | POA: Diagnosis not present

## 2014-06-30 NOTE — Progress Notes (Signed)
Followup Note: Gyn-Onc  Initial consult was requested by Dr. Cletis Media for the evaluation of RONNIE MALLETTE 48 y.o. 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  CC: Dr Cletis Media, Dr Fermin Schwab Chief Complaint  Patient presents with  . neoplasm of uncertain behavior of ovary    Assessment/Plan:  Ms. MAYZEE REICHENBACH  is a 48 y.o.  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  She has recurrent disease by virtue of elevated AMH (30.6 on 03/24/14) and Inhibin B (139 on 03/24/14) and radiographic evidence of peritoneal nodules (peri-hepatic, peri-colic, omental, uterine and left ovarian). She is relatively asymptomatic (occasional right abdominal pains).   I am recommending secondary debulking surgery via exploratory laparotomy, with TAH, LSO and resection of the peritoneal and perihepatic nodules, possible bowel resection, possible hepatic resection (though the lesions appear fairly distinct and separate of the bowels and liver parenchyma on my review of the films). I recommend adjuvant chemotherapy postop with either carboplatin and paclitaxel x 6 cycles or BEP x 3-4 cycles to consolidate surgical debulking.  The patient feels strongly that she would prefer to not have cytotoxic therapy and would only accept hormonal modulation strategies (eg progestins/tamoxifen) as part of adjuvant therapy due to concerns regarding toxicity and futility of cytotoxics. I discussed with her that there have not been head to head studies which compare cytotoxic adjuvant therapy to hormonal therapy in the recurrent setting. Therefore it is not possible for me to definitively compare their efficacy, though in single arm studies, the response rates for primary cytotoxic therapy are higher than the response rates for hormonal therapy, which is why standard of  care adjuvant therapy is with cytotoxic therapy, and hormonal therapy is typically reserved for patients who fail or progress on or after cytotoxics or have medical co morbidities which preclude safe administration of chemotherapy. I provided [place] with data compiled from "Up to Date" which lists several of these major phase II clinical trials for her to review and consider.  We will schedule her debulking surgery for late November, pending her decisions from seeking second opinions elsewhere.  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 This was an incidental intraoperative finding. Postoperative imaging (baseline) revealed no other measurable disease (<2cm lower uterine segment fibroid), and she had normal tumor markers. She was clinically staged and entered surveillance examinations and tumor marker assessment. She had been NED since that time (though with some patchy followup). She was seen for surveillance in July 2015 by Dr Cletis Media.   Her most recent antimullarian hormone level is mildly elevated at 30.16 these values were taken on 03/24/2014. Anti mullarian hormone was previously her 4.2 on 03/19/2012 which was considered borderline high. Inhibin B level is 139 on 03/24/2014 it is considered which is within normal range for a woman of her age.   She reported irregular menses - missed periods and intermenstrual bleeding. She reported intermittent mild right abdominal pains (did not require analgesia for this).   US performed in July 2015 showed a normal sized uterus with 4 small (2-3cm) fibroids. No pelvic masses were visualized. No ovarian masses were seen. The left ovary measured 2.7cm.   A CT of the abdomen  and pelvis was ordered to better evaluate her symptoms and the elevated tumor marker as no apparent recurrence was discovered on examination. The patient rescheduled  this appointment to June 16, 2014 due to personal conflicts in scheduling.  It revealed several peritoneal soft tissue nodules seen in the right abdomen along the inferior margin of the liver, largest measuring 1.2x1.8cm, in the right lower quadrant mesentery measuring 1.2x1.4cm, in the right lower quadrant measuring 1cm, and a 63mm soft tissue nodules in the left lower quadrant omentum. The uterus contained a new lesion that was ill-defined and masslike in the fundus involving both the cavity and myometrium (it measures approximately 3x4cm). The left ovary included a new lesion measuring 2.9x2.7cm which was cystic and solid.   Interval History: She continues to have no bloating or abdominal distension no edema and no shortness of breath. She does have mild right sided abdominal pains. She has done extensive internet research after being informed of her diagnosis of recurrence of the granulosa cell tumor, and feels strongly that she does not want chemotherapy (cytotoxic agents) as part of her treatment because she has not read of literature that shows that they will offer definitive cure, she is anxious about symptoms and toxicity, and she would like to try hormonal therapy instead (feeling that her reading indicates that the response to hormonal therapy is comparable to cytotoxic therapy). She is open and amenable to surgical resection. She is seeking 2nd opinions.   Current Meds:  Outpatient Encounter Prescriptions as of 06/30/2014  Medication Sig  . Omega-3 Fatty Acids (FISH OIL CONCENTRATE PO) Take 1 each by mouth.    Allergy: No Known Allergies  Social Hx:   History   Social History  . Marital Status: Divorced    Spouse Name: N/A    Number of Children: N/A  . Years of Education: N/A   Occupational History  . Not on file.   Social History Main Topics  . Smoking status: Never Smoker   . Smokeless tobacco: Never Used  . Alcohol Use: No  . Drug Use: No  . Sexual Activity: Yes     Birth Control/ Protection: Condom   Other Topics Concern  . Not on file   Social History Narrative  . No narrative on file    Past Surgical Hx:  Past Surgical History  Procedure Laterality Date  . Robotic myomectomy  11/2009  . Liposuction      abd.   [facility] Parents      Granulosa tumor  . Leep  2002  . Robotic right oophorectomy Right 11/03/09    granulosa cell tumor, clincal stage IA    Past Medical Hx:  Past Medical History  Diagnosis Date  . Abnormal Pap smear   . Asthma     childhood   . Sleep apnea   . PCOS (polycystic ovarian syndrome)   . Ankylosing spondylitis   . Uveitis     Past Gynecological History: stage I granulosa cell tumor of the ovary, perimenopausal.  No LMP recorded.  Family Hx:  Family History  Problem Relation Age of Onset  . Stroke Paternal Grandfather   . Heart disease Paternal Grandfather   . Heart disease Paternal Grandmother   . Stroke Paternal Grandmother   . Colon cancer Maternal Grandmother   . Heart disease Maternal Grandmother   . Stroke Maternal Grandmother   . Stroke Maternal Grandfather   . Heart disease Maternal Grandfather   . Hypertension Mother   . Cancer Mother  cervical treated     Review of Systems:  Constitutional  Feels well,    ENT Normal appearing ears and nares bilaterally Skin/Breast  No rash, sores, jaundice, itching, dryness Cardiovascular  No chest pain, shortness of breath, or edema  Pulmonary  No cough or wheeze.  Gastro Intestinal  No nausea, vomitting, or diarrhoea. No bright red blood per rectum, no abdominal pain, change in bowel movement, or constipation.  Genito Urinary  No frequency, urgency, dysuria, see HPI Musculo Skeletal  No myalgia, arthralgia, joint swelling or pain  Neurologic  No weakness, numbness, change in gait,  Psychology  No depression, anxiety, insomnia.   Vitals:  Blood pressure 158/94, pulse 77, temperature 98.6 F (37 C), temperature source Oral, resp. rate 18, height 5'  3" (1.6 m), weight 178 lb 3.2 oz (80.831 kg).  Physical Exam: WD in NAD Neck  Supple NROM, without any enlargements.  Lymph Node Survey No cervical supraclavicular or inguinal adenopathy Cardiovascular  Pulse normal rate, regularity and rhythm. S1 and S2 normal.  Lungs  Clear to auscultation bilateraly, without wheezes/crackles/rhonchi. Good air movement.  Skin  No rash/lesions/breakdown  Psychiatry  Alert and oriented to person, place, and time  Abdomen  Normoactive bowel sounds, abdomen soft, non-tender and obese without evidence of hernia.  Back No CVA tenderness Genito Urinary  Deferred.   50 minutes was spent with the patient and her family with >50% spent in face to face counseling.  Donaciano Eva, MD   06/30/2014, 4:35 PM

## 2014-06-30 NOTE — Patient Instructions (Signed)
Preparing for your Surgery  Plan for surgery on November 24 with Dr. Fermin Schwab.  Pre-operative Testing -You will receive a phone call from presurgical testing at [facility] to arrange for a pre-operative testing appointment before your surgery.  This appointment normally occurs one to two weeks before your scheduled surgery.   -Bring your insurance card, copy of an advanced directive if applicable, medication list  -At that visit, you will be asked to sign a consent for a possible blood transfusion in case a transfusion becomes necessary during surgery.  The need for a blood transfusion is rare but having consent is a necessary part of your care.     -You should not be taking blood thinners or aspirin at least ten days prior to surgery unless instructed by your surgeon.  Day Before Surgery at [facility] will be asked to take in only clear liquids the day before surgery.  Examples of clear liquids include broths, jello, and clear juices.  You may also be advised to perform a Miralax bowel prep or fleets enema the night before your surgery based off of your provider's recommendations.  You will be advised to have nothing to eat or drink after midnight the evening before.    Your role in recovery Your role is to become active as soon as directed by your doctor, while still giving yourself time to heal.  Rest when you feel tired. You will be asked to do the following in order to speed your recovery:  - Cough and breathe deeply. This helps toclear and expand your lungs and can prevent pneumonia. You may be given a spirometer to practice deep breathing. A staff member will show you how to use the spirometer. - Do mild physical activity. Walking or moving your legs help your circulation and body functions return to normal. A staff member will help you when you try to walk and will provide you with simple exercises. Do not try to get up or walk alone the first time. -  Actively manage your pain. Managing your pain lets you move in comfort. We will ask you to rate your pain on a scale of zero to 10. It is your responsibility to tell your doctor or nurse where and how much you hurt so your pain can be treated.  Special Considerations -If you are diabetic, you may be placed on insulin after surgery to have closer control over your blood sugars to promote healing and recovery.  This does not mean that you will be discharged on insulin.  If applicable, your oral antidiabetics will be resumed when you are tolerating a solid diet.  -Your final pathology results from surgery should be available by the Friday after surgery and the results will be relayed to you when available.

## 2014-07-11 ENCOUNTER — Telehealth: Payer: Self-pay | Admitting: Gynecologic Oncology

## 2014-07-11 NOTE — Telephone Encounter (Signed)
Returning call to patient.  Patient called and left message stating she would like to post-pone surgery with Dr. Fermin Schwab in November.  Message left for her to call the office for any questions or concerns.

## 2016-06-22 IMAGING — CT CT ABD-PELV W/ CM
2 of 5 series · 16 of 46 positions shown, 18 images · IV contrast (omnipaque)
Comparison: 12/10/2009

CLINICAL DATA: Followup for ovarian granulosa cell tumor. Prior
right oophorectomy. Elevated tumor markers.

EXAM:
CT CHEST, ABDOMEN, AND PELVIS WITH CONTRAST
TECHNIQUE: Multidetector CT imaging of the chest, abdomen and pelvis was
performed following the standard protocol during bolus
administration of intravenous contrast.
CONTRAST:  100mL OMNIPAQUE IOHEXOL 300 MG/ML  SOLN

[Series 2: cap with st · axial · 0.68mm/px · z∈[-545,-45]mm · 13 of 114 slices shown, 15 images]
[im 7/114  soft-tissue]
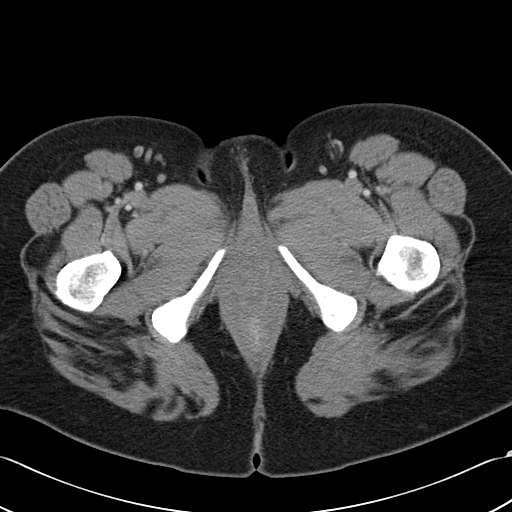
[im 7/114  bone]
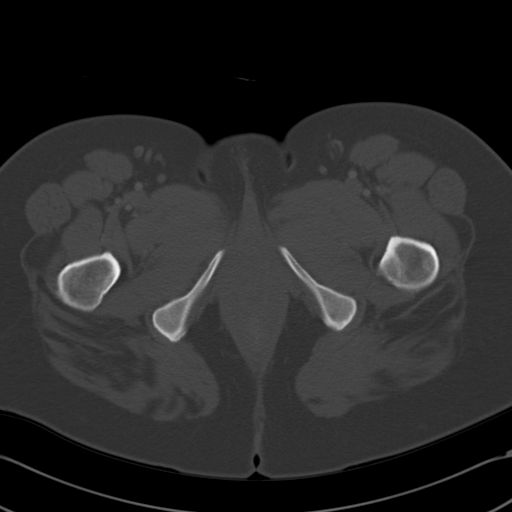
[im 14/114  soft-tissue]
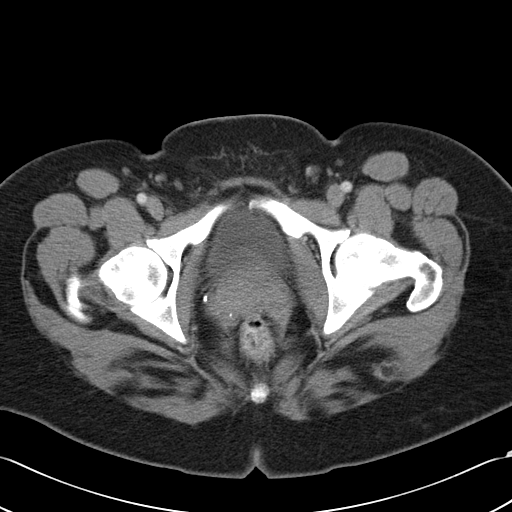
[im 27/114  soft-tissue]
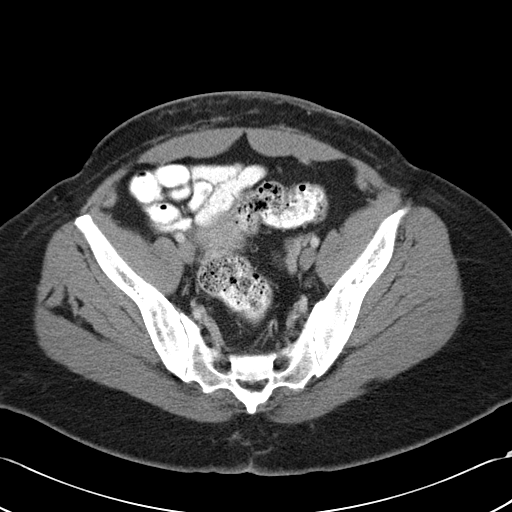
[im 34/114  soft-tissue]
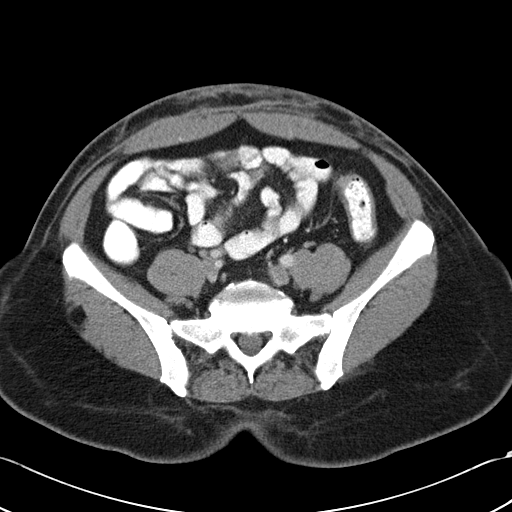
[im 40/114  soft-tissue]
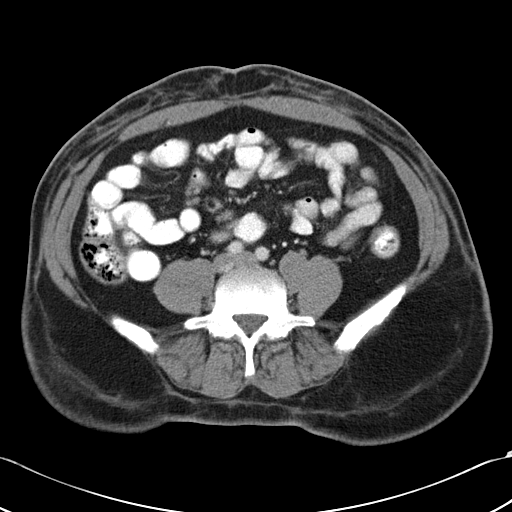
[im 47/114  soft-tissue]
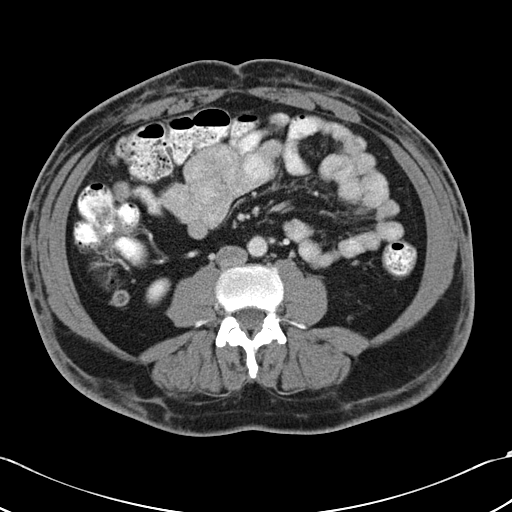
[im 60/114  soft-tissue]
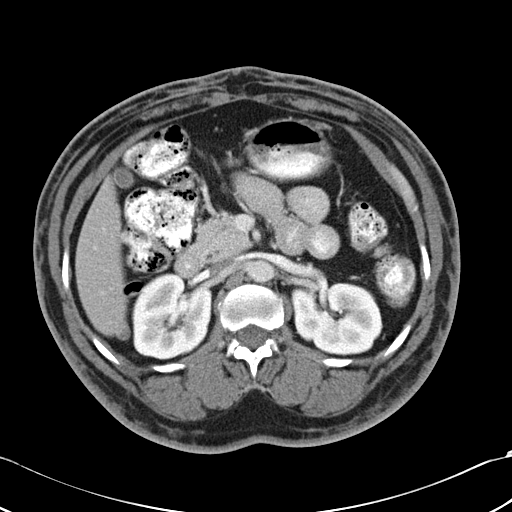
[im 67/114  soft-tissue]
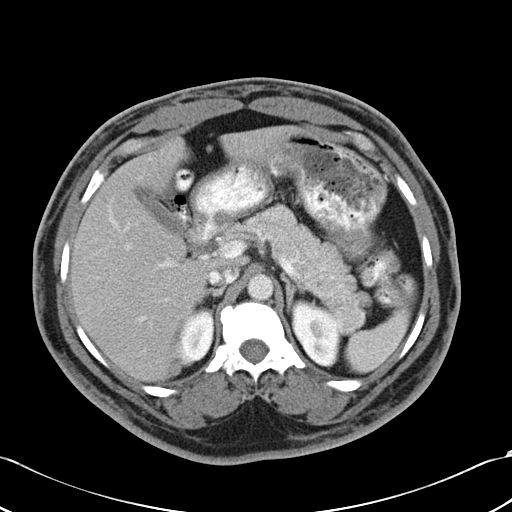
[im 74/114  soft-tissue]
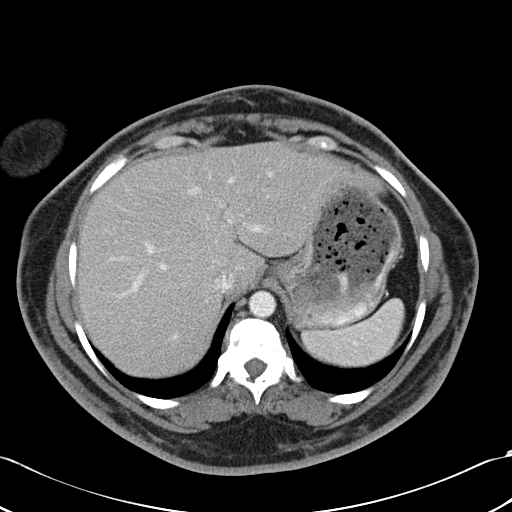
[im 74/114  bone]
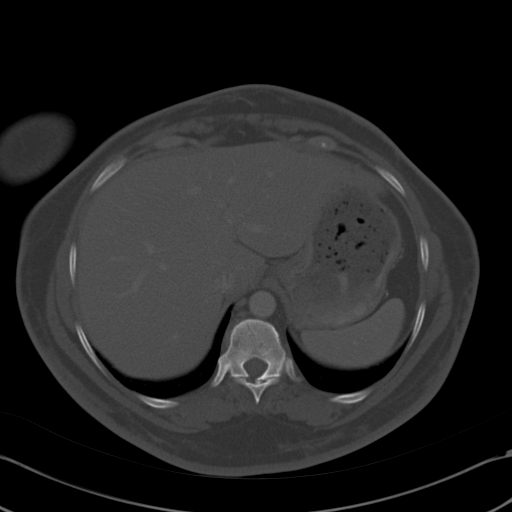
[im 80/114  soft-tissue]
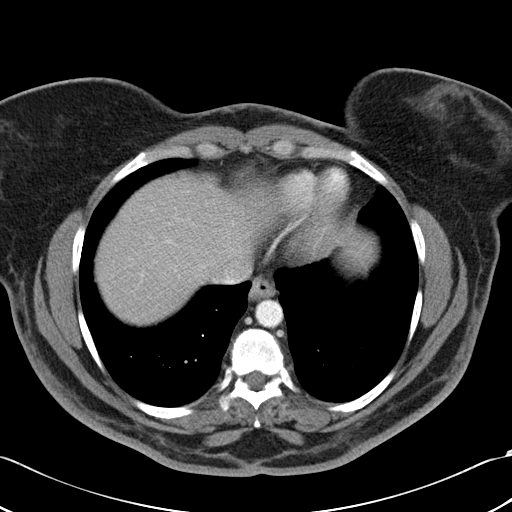
[im 87/114  soft-tissue]
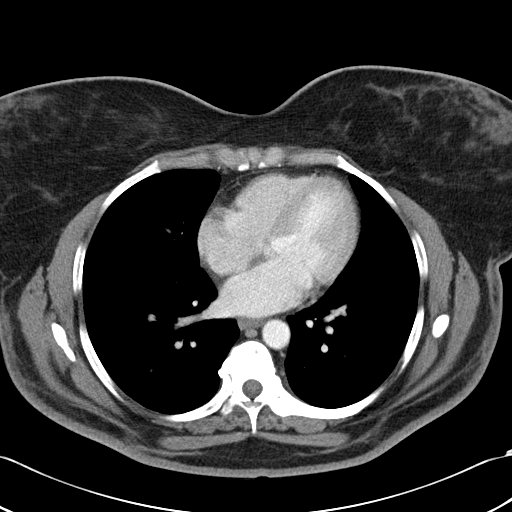
[im 100/114  soft-tissue]
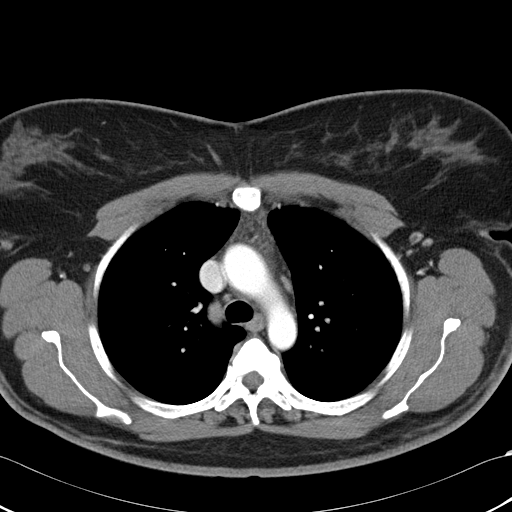
[im 107/114  soft-tissue]
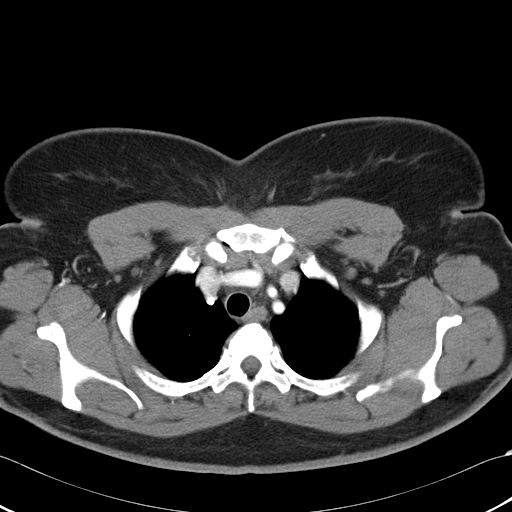

[Series 602: <mpr thick range> · coronal · 1.11mm/px · 3 of 146 slices shown]
[im 49/146  soft-tissue]
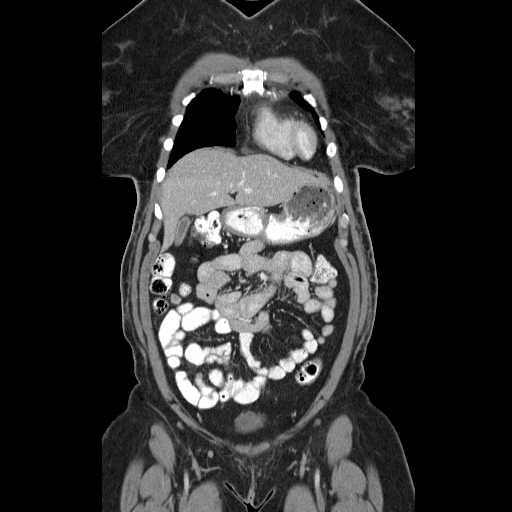
[im 65/146  soft-tissue]
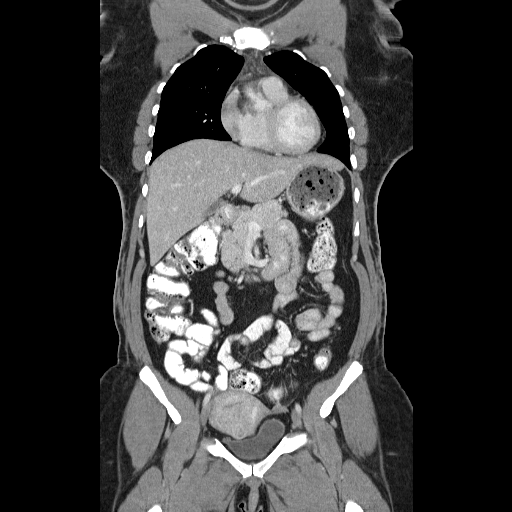
[im 81/146  soft-tissue]
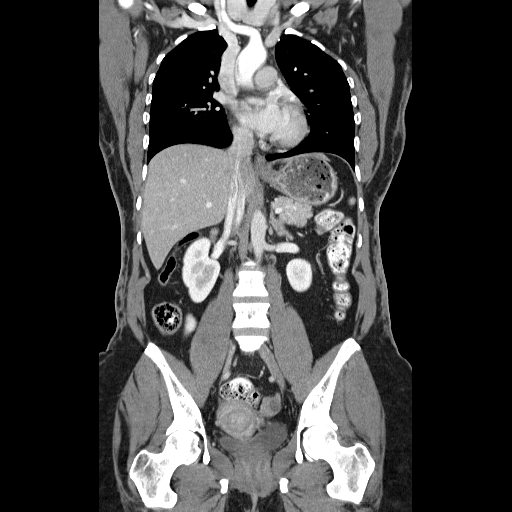

[16 of 46 positions shown; findings below may reference images not displayed]

FINDINGS: CT CHEST FINDINGS

Mediastinum/Hilar Regions: No masses or pathologically enlarged
lymph nodes identified.

Other Thoracic Lymphadenopathy:  None.

Lungs:  No pulmonary infiltrate or mass identified.

Pleura:  No evidence of effusion or mass.

Vascular/Cardiac:  No acute findings identified.

Musculoskeletal:  No suspicious bone lesions identified.

Other:  None.

CT ABDOMEN AND PELVIS FINDINGS

Hepatobiliary: No masses or other significant abnormality
identified.

Pancreas: No mass, inflammatory changes, or other parenchymal
abnormality identified.

Spleen:  Within normal limits in size and appearance.

Adrenal Glands:  No mass identified.

Kidneys/Urinary Tract: No masses identified. No evidence of
hydronephrosis.

Stomach/Bowel/Peritoneum: Several peritoneal soft tissue nodules are
seen in the right abdomen along the inferior margin of the liver,
largest measuring 1.2 x 1.8 cm on image 53, in the right lower
quadrant mesentery measuring 1.2 x 1.4 cm on image 71, in the right
lower quadrant on image 90 measuring 10 mm. A 5 mm soft tissue
nodules also seen in the left lower quadrant omentum measuring 5 mm
on image 77. This is consistent with peritoneal tumor. No evidence
of ascites. No evidence of bowel obstruction.

Vascular/Lymphatic: No pathologically enlarged lymph nodes
identified. No other significant abnormality identified.

Reproductive: 2 well-circumscribed myometrial masses are seen in the
uterus, largest measuring 2.4 cm which is increased in size from
cm previously. These likely represent fibroids. There is also an
ill-defined masslike area seen in the uterine fundus involving both
the endometrial cavity and myometrium which measures approximately 3
x 4 cm. Given the ill-defined nature of this mass, endometrial
carcinoma or leiomyosarcoma cannot be excluded.

In addition, there is a lesion in the left ovary which measures
approximately 2.9 x 2.7 cm on image 91, which is at least partially
cystic and may have a solid component as well. Left ovarian neoplasm
cannot be excluded.

Other:  None.

Musculoskeletal:  No suspicious bone lesions identified.
IMPRESSION: Multiple new peritoneal soft tissue nodules in the abdomen and
pelvis, consistent with peritoneal tumor.

Ill-defined masslike area in the uterine fundus, which could
represent an associated endometrial carcinoma. In addition, there is
a possible cystic and solid mass in the left ovary, and left ovarian
neoplasm cannot be excluded. Pelvic MRI without and with contrast is
recommended for further evaluation of these findings.

No evidence of metastatic disease within the thorax.
